# Patient Record
Sex: Male | Born: 1990 | Race: White | Hispanic: No | Marital: Single | Smoking: Current every day smoker
Health system: Southern US, Community
[De-identification: ages and names within clinical notes are randomized; demographics above are authoritative.]

## PROBLEM LIST (undated history)

## (undated) ENCOUNTER — Ambulatory Visit: Discharge status: Against Medical Advice | Payer: Self-pay

## (undated) HISTORY — PX: NO PAST SURGERIES: SHX2092

---

## 2019-03-09 ENCOUNTER — Ambulatory Visit
Admission: EM | Admit: 2019-03-09 | Discharge: 2019-03-09 | Disposition: A | Discharge status: Home / Self Care | Payer: BLUE CROSS/BLUE SHIELD | Attending: Family Medicine | Admitting: Family Medicine

## 2019-03-09 ENCOUNTER — Encounter: Payer: Self-pay | Admitting: Emergency Medicine

## 2019-03-09 ENCOUNTER — Other Ambulatory Visit: Payer: Self-pay

## 2019-03-09 ENCOUNTER — Ambulatory Visit (INDEPENDENT_AMBULATORY_CARE_PROVIDER_SITE_OTHER): Payer: BLUE CROSS/BLUE SHIELD

## 2019-03-09 DIAGNOSIS — M79675 Pain in left toe(s): Secondary | ICD-10-CM

## 2019-03-09 DIAGNOSIS — S92425A Nondisplaced fracture of distal phalanx of left great toe, initial encounter for closed fracture: Secondary | ICD-10-CM

## 2019-03-09 MED ORDER — HYDROCODONE-ACETAMINOPHEN 5-325 MG PO TABS: ORAL_TABLET | ORAL | 0 refills | Status: AC

## 2019-03-09 NOTE — ED Triage Notes (Signed)
Pt c/o left great toe pain. He ran over his toe with a pallet jack at work about an hour ago. He states that he can not move his toe and it feels swollen.

## 2019-03-09 NOTE — Discharge Instructions (Signed)
Rest, ice, elevated, use hard soled shoe ("post-op shoe") Follow up with podiatrist

## 2019-03-26 NOTE — ED Provider Notes (Signed)
[facility]    CSN: [id number] Arrival date & time: 03/09/19  1324     History   Chief Complaint Chief Complaint  Patient presents with  . Toe Pain    left great toe    HPI Kyle Patterson is a 28 y.o. male.   28 yo male with a c/o left great toe pain and swelling after running over his toe with a pallet jack about one hour ago.   The history is provided by the patient.  Toe Pain     History reviewed. No pertinent past medical history.  There are no active problems to display for this patient.   Past Surgical History:  Procedure Laterality Date  . NO PAST SURGERIES         Home Medications    Prior to Admission medications   Medication Sig Start Date End Date Taking? Authorizing Provider  HYDROcodone-acetaminophen (NORCO/VICODIN) 5-325 MG tablet 1-2 tabs po bid prn 03/09/19   Payton Mccallumonty, Reyanna Baley, MD    Family History Family History  Problem Relation Age of Onset  . Healthy Mother     Social History Social History   Tobacco Use  . Smoking status: Current Every Day Smoker    Packs/day: 0.25  . Smokeless tobacco: Never Used  Substance Use Topics  . Alcohol use: Not Currently  . Drug use: Not Currently     Allergies   Patient has no known allergies.   Review of Systems Review of Systems   Physical Exam Triage Vital Signs ED Triage Vitals  Enc Vitals Group     BP 03/09/19 1355 123/89     Pulse Rate 03/09/19 1355 95     Resp 03/09/19 1355 18     Temp 03/09/19 1355 98.1 F (36.7 C)     Temp Source 03/09/19 1355 Oral     SpO2 03/09/19 1355 99 %     Weight 03/09/19 1352 160 lb (72.6 kg)     Height 03/09/19 1352 5\' 7"  (1.702 m)     Head Circumference --      Peak Flow --      Pain Score 03/09/19 1351 7     Pain Loc --      Pain Edu? --      Excl. in [place]? --    No data found.  Updated Vital Signs BP 123/89 (BP Location: Left Arm)   Pulse 95   Temp 98.1 F (36.7 C) (Oral)   Resp 18   Ht 5\' 7"  (1.702 m)   Wt 72.6 kg   SpO2  99%   BMI 25.06 kg/m   Visual Acuity Right Eye Distance:   Left Eye Distance:   Bilateral Distance:    Right Eye Near:   Left Eye Near:    Bilateral Near:     Physical Exam Vitals signs and nursing note reviewed.  Constitutional:      General: He is not in acute distress.    Appearance: He is not toxic-appearing or diaphoretic.  Musculoskeletal:     Left foot: Bony tenderness (over left great toe) and swelling present.  Neurological:     Mental Status: He is alert.      UC Treatments / Results  Labs (all labs ordered are listed, but only abnormal results are displayed) Labs Reviewed - No data to display  EKG None  Radiology No results found.  Procedures Procedures (including critical care time)  Medications Ordered in UC Medications - No data to display  Initial Impression / Assessment and Plan / UC Course  I have reviewed the triage vital signs and the nursing notes.  Pertinent labs & imaging results that were available during my care of the patient were reviewed by me and considered in my medical decision making (see chart for details).      Final Clinical Impressions(s) / UC Diagnoses   Final diagnoses:  Nondisplaced fracture of distal phalanx of left great toe, initial encounter for closed fracture     Discharge Instructions     Rest, ice, elevated, use hard soled shoe ("post-op shoe") Follow up with podiatrist    ED Prescriptions    Medication Sig Dispense Auth. Provider   HYDROcodone-acetaminophen (NORCO/VICODIN) 5-325 MG tablet 1-2 tabs po bid prn 6 tablet Delphina Schum, Pamala Hurry, MD      1. x-ray results and diagnosis reviewed with patient 2. rx as per orders above; reviewed possible side effects, interactions, risks and benefits  3. Recommend supportive treatment as above  4. Follow-up prn if symptoms worsen or don't improve Controlled Substance Prescriptions [place] Controlled Substance Registry consulted? Not Applicable   Payton Mccallum, MD  03/26/19 [phone]

## 2019-06-25 ENCOUNTER — Encounter: Payer: Self-pay | Admitting: Emergency Medicine

## 2019-06-25 ENCOUNTER — Ambulatory Visit
Admission: EM | Admit: 2019-06-25 | Discharge: 2019-06-25 | Disposition: A | Discharge status: Home / Self Care | Payer: BLUE CROSS/BLUE SHIELD | Attending: Emergency Medicine | Admitting: Emergency Medicine

## 2019-06-25 ENCOUNTER — Other Ambulatory Visit: Payer: Self-pay

## 2019-06-25 DIAGNOSIS — R112 Nausea with vomiting, unspecified: Secondary | ICD-10-CM

## 2019-06-25 DIAGNOSIS — R05 Cough: Secondary | ICD-10-CM

## 2019-06-25 DIAGNOSIS — R51 Headache: Secondary | ICD-10-CM

## 2019-06-25 DIAGNOSIS — Z20828 Contact with and (suspected) exposure to other viral communicable diseases: Secondary | ICD-10-CM

## 2019-06-25 DIAGNOSIS — R6883 Chills (without fever): Secondary | ICD-10-CM

## 2019-06-25 DIAGNOSIS — Z20822 Contact with and (suspected) exposure to covid-19: Secondary | ICD-10-CM

## 2019-06-25 NOTE — ED Provider Notes (Signed)
[facility]    CSN: [id number] Arrival date & time: 06/25/19  1743      History   Chief Complaint Chief Complaint  Patient presents with  . Cough  . Chills    HPI Kyle Patterson is a 28 y.o. male presenting with chills, nausea, sweats and a mild cough. Pt recently moved to area from [place] and was visiting there last week. Family members in the area are known to be COVID-19 positive. Pt also works in [employer]. Symptoms started 2 days ago and have been consistent. Pt denies body aches, shortness of breathe and fever. Pt states he does not drink water, he only drink soda and he also smoke 1.5 packs a day.   History reviewed. No pertinent past medical history.  There are no active problems to display for this patient.   Past Surgical History:  Procedure Laterality Date  . NO PAST SURGERIES         Home Medications    Prior to Admission medications   Medication Sig Start Date End Date Taking? Authorizing Provider  HYDROcodone-acetaminophen (NORCO/VICODIN) 5-325 MG tablet 1-2 tabs po bid prn 03/09/19   Norval Gable, MD    Family History Family History  Problem Relation Age of Onset  . Healthy Mother     Social History Social History   Tobacco Use  . Smoking status: Current Every Day Smoker    Packs/day: 0.25  . Smokeless tobacco: Never Used  Substance Use Topics  . Alcohol use: Not Currently  . Drug use: Not Currently     Allergies   Patient has no known allergies.   Review of Systems Review of Systems  Constitutional: Positive for appetite change and chills. Negative for fever.  Respiratory: Positive for cough. Negative for shortness of breath.   Cardiovascular: Negative for chest pain.  Gastrointestinal: Positive for nausea and vomiting. Negative for diarrhea.  Musculoskeletal: Negative for arthralgias and myalgias.  Skin: Negative for color change.  Neurological: Positive for headaches. Negative for dizziness and weakness.      Physical Exam Triage Vital Signs ED Triage Vitals  Enc Vitals Group     BP 06/25/19 1804 116/79     Pulse Rate 06/25/19 1804 (!) 136     Resp 06/25/19 1804 18     Temp 06/25/19 1804 98.5 F (36.9 C)     Temp Source 06/25/19 1804 Oral     SpO2 06/25/19 1804 98 %     Weight 06/25/19 1805 156 lb (70.8 kg)     Height 06/25/19 1805 5\' 8"  (1.727 m)     Head Circumference --      Peak Flow --      Pain Score 06/25/19 1804 3     Pain Loc --      Pain Edu? --      Excl. in [place]? --    No data found.  Updated Vital Signs BP 116/79 (BP Location: Right Arm)   Pulse (!) 136   Temp 98.5 F (36.9 C) (Oral)   Resp 18   Ht 5\' 8"  (1.727 m)   Wt 156 lb (70.8 kg)   SpO2 98%   BMI 23.72 kg/m   Visual Acuity Right Eye Distance:   Left Eye Distance:   Bilateral Distance:    Right Eye Near:   Left Eye Near:    Bilateral Near:     Physical Exam Vitals signs and nursing note reviewed.  Constitutional:      Appearance: He is  well-developed.  HENT:     Head: Normocephalic and atraumatic.  Eyes:     Conjunctiva/sclera: Conjunctivae normal.  Neck:     Musculoskeletal: Neck supple.  Cardiovascular:     Rate and Rhythm: Normal rate and regular rhythm.     Heart sounds: No murmur.  Pulmonary:     Effort: Pulmonary effort is normal. No respiratory distress.     Breath sounds: Normal breath sounds.  Abdominal:     Palpations: Abdomen is soft.     Tenderness: There is no abdominal tenderness.  Skin:    General: Skin is warm and dry.  Neurological:     Mental Status: He is alert.      UC Treatments / Results  Labs (all labs ordered are listed, but only abnormal results are displayed) Labs Reviewed  NOVEL CORONAVIRUS, NAA (HOSPITAL ORDER, SEND-OUT TO REF LAB)    EKG   Radiology No results found.  Procedures Procedures (including critical care time)  Medications Ordered in UC Medications - No data to display  Initial Impression / Assessment and Plan / UC Course  I  have reviewed the triage vital signs and the nursing notes.  Pertinent labs & imaging results that were available during my care of the patient were reviewed by me and considered in my medical decision making (see chart for details).     Pt presents with the above symptoms, diagnosed with close exposure to COVID-19 and treated as such. Pt tested in clinic, strict isolation precautions reviewed. All questions answered and all concerns addressed.   Final Clinical Impressions(s) / UC Diagnoses   Final diagnoses:  Close Exposure to Covid-19 Virus   Discharge Instructions   None    ED Prescriptions    None     Controlled Substance Prescriptions [place] Controlled Substance Registry consulted? Not Applicable    Bailey MechLunise Gram Siedlecki, DNP, NP-c     Bailey MechBenjamin, Vertis Scheib, NP 06/25/19 [phone]

## 2019-06-25 NOTE — ED Triage Notes (Signed)
Patient c/o cold sweats and chills that started 2 days ago. He stated he vomited 3 times last night. No vomiting today. Patient does report an elevated heart rate today. He also reports a headache and mild cough.

## 2019-06-28 ENCOUNTER — Encounter (HOSPITAL_COMMUNITY): Payer: Self-pay

## 2019-06-28 LAB — NOVEL CORONAVIRUS, NAA (HOSP ORDER, SEND-OUT TO REF LAB; TAT 18-24 HRS): SARS-CoV-2, NAA: NOT DETECTED

## 2020-01-01 IMAGING — CR LEFT SECOND TOE
3 series · 3 of 3 positions shown · non-contrast
Comparison: None.

CLINICAL DATA: Heavy pallet rolled onto left foot. Pain in first
and second toes.

EXAM:
LEFT SECOND TOE

[toe ap]
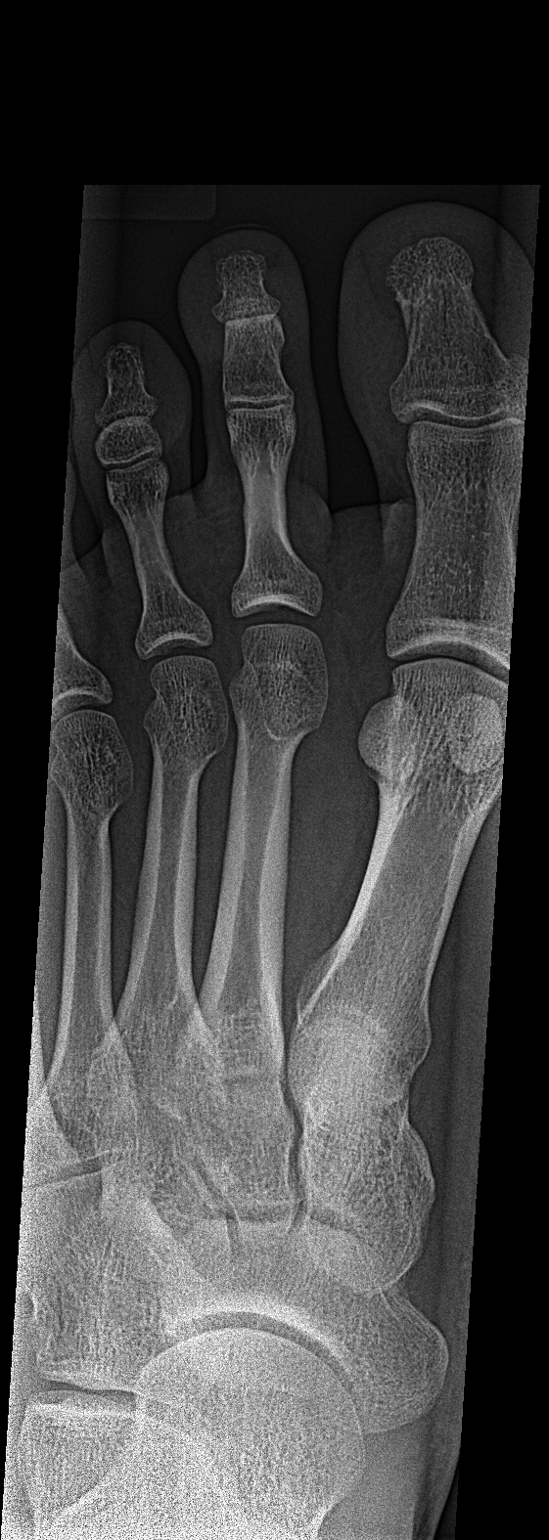

[toe obl]
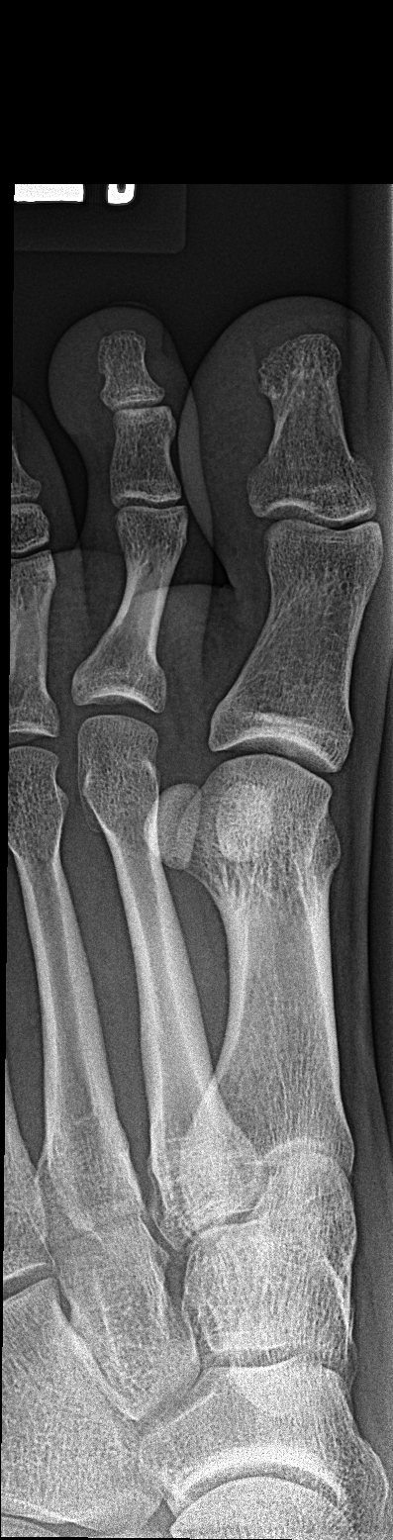

[toe lat]
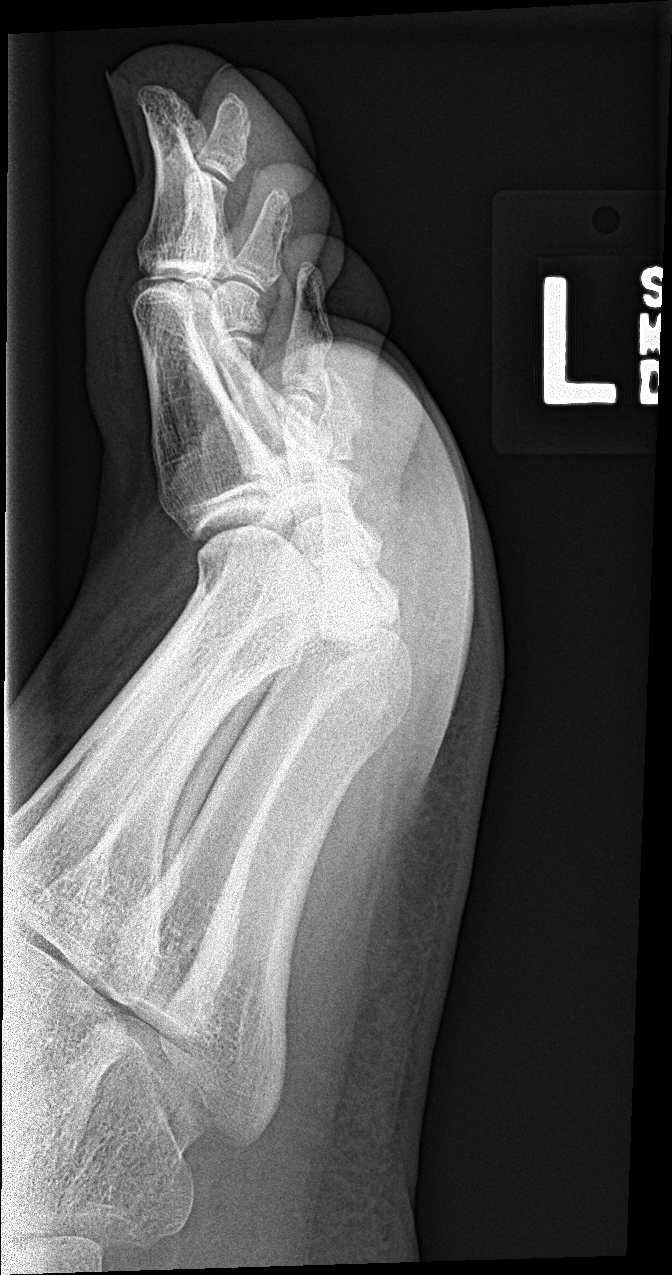

[3 of 3 positions shown; findings below may reference images not displayed]

FINDINGS: Nondisplaced fracture involving the distal aspect of the distal
phalanx of the great toe. Proximal phalanx appears intact and
normally aligned. Soft tissue swelling about the distal phalanx.
IMPRESSION: Nondisplaced fracture involving the distal aspect of the distal
phalanx of the great toe.

## 2020-01-01 IMAGING — CR LEFT GREAT TOE
3 series · 3 of 3 positions shown · non-contrast
Comparison: None.

CLINICAL DATA: Heavy pallet rolled onto left foot. Pain in first
and second toes.

EXAM:
LEFT GREAT TOE

[toe ap]
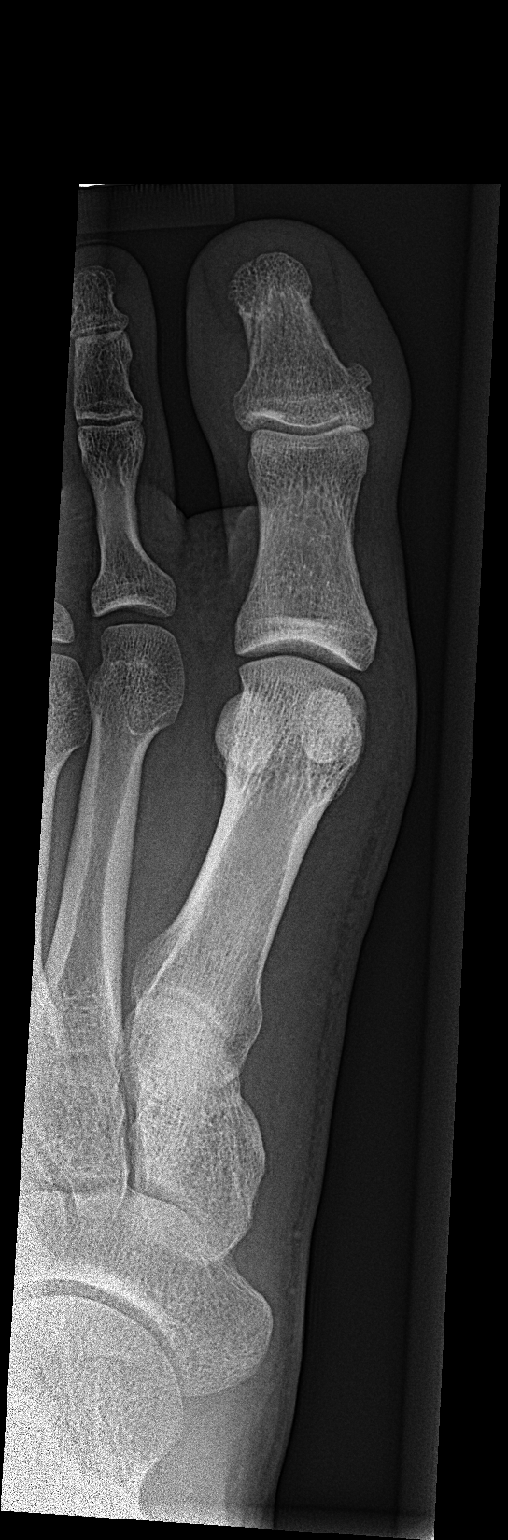

[toe obl]
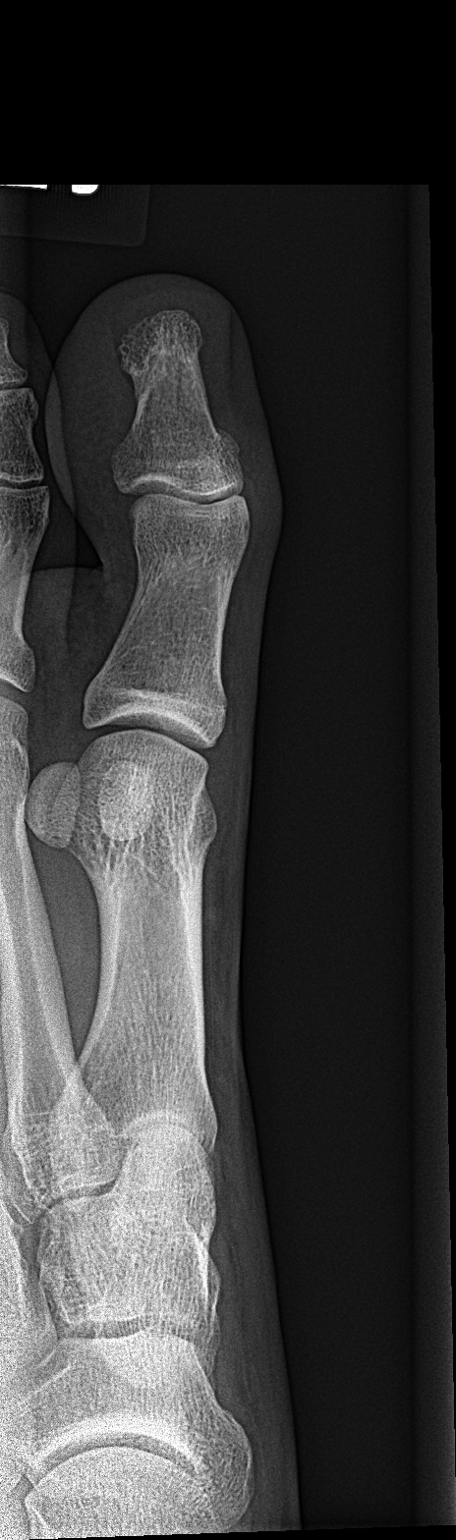

[toe lat]
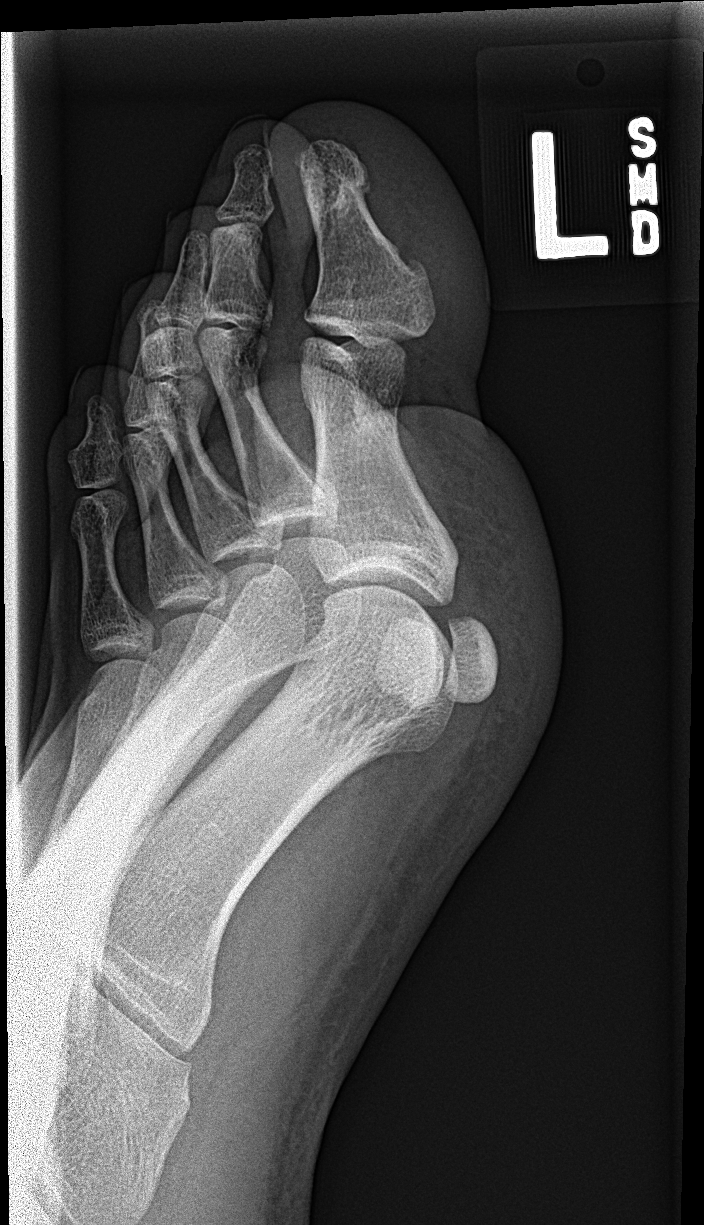

[3 of 3 positions shown; findings below may reference images not displayed]

FINDINGS: Nondisplaced fracture at the distal aspect of the distal phalanx of
the great toe. No displaced fracture fragment seen. Joint spaces are
normally aligned.
IMPRESSION: Nondisplaced fracture at the distal aspect of the distal phalanx of
the great toe.
